# Patient Record
Sex: Female | Born: 1960 | Race: White | Hispanic: No | Marital: Married | Smoking: Former smoker
Health system: Southern US, Community
[De-identification: ages and names within clinical notes are randomized; demographics above are authoritative.]

## PROBLEM LIST (undated history)

## (undated) DIAGNOSIS — C801 Malignant (primary) neoplasm, unspecified: Secondary | ICD-10-CM

## (undated) DIAGNOSIS — F329 Major depressive disorder, single episode, unspecified: Secondary | ICD-10-CM

## (undated) DIAGNOSIS — I639 Cerebral infarction, unspecified: Secondary | ICD-10-CM

## (undated) DIAGNOSIS — F32A Depression, unspecified: Secondary | ICD-10-CM

## (undated) HISTORY — PX: ABDOMINAL HYSTERECTOMY: SHX81

## (undated) HISTORY — PX: BREAST SURGERY: SHX581

## (undated) HISTORY — PX: CHOLECYSTECTOMY: SHX55

---

## 2013-09-14 ENCOUNTER — Emergency Department (HOSPITAL_COMMUNITY)
Admission: EM | Admit: 2013-09-14 | Discharge: 2013-09-14 | Disposition: A | Discharge status: Home / Self Care | Payer: 59 | Attending: Emergency Medicine | Admitting: Emergency Medicine

## 2013-09-14 ENCOUNTER — Encounter (HOSPITAL_COMMUNITY): Payer: Self-pay | Admitting: Emergency Medicine

## 2013-09-14 ENCOUNTER — Emergency Department (HOSPITAL_COMMUNITY): Payer: 59

## 2013-09-14 DIAGNOSIS — Z8673 Personal history of transient ischemic attack (TIA), and cerebral infarction without residual deficits: Secondary | ICD-10-CM | POA: Diagnosis not present

## 2013-09-14 DIAGNOSIS — F411 Generalized anxiety disorder: Secondary | ICD-10-CM | POA: Diagnosis not present

## 2013-09-14 DIAGNOSIS — R55 Syncope and collapse: Secondary | ICD-10-CM | POA: Insufficient documentation

## 2013-09-14 DIAGNOSIS — Z7982 Long term (current) use of aspirin: Secondary | ICD-10-CM | POA: Diagnosis not present

## 2013-09-14 DIAGNOSIS — Z79899 Other long term (current) drug therapy: Secondary | ICD-10-CM | POA: Diagnosis not present

## 2013-09-14 DIAGNOSIS — R209 Unspecified disturbances of skin sensation: Secondary | ICD-10-CM | POA: Diagnosis not present

## 2013-09-14 DIAGNOSIS — G8190 Hemiplegia, unspecified affecting unspecified side: Secondary | ICD-10-CM

## 2013-09-14 DIAGNOSIS — Z853 Personal history of malignant neoplasm of breast: Secondary | ICD-10-CM | POA: Diagnosis not present

## 2013-09-14 DIAGNOSIS — R200 Anesthesia of skin: Secondary | ICD-10-CM

## 2013-09-14 DIAGNOSIS — Z87891 Personal history of nicotine dependence: Secondary | ICD-10-CM | POA: Insufficient documentation

## 2013-09-14 DIAGNOSIS — R519 Headache, unspecified: Secondary | ICD-10-CM

## 2013-09-14 DIAGNOSIS — F329 Major depressive disorder, single episode, unspecified: Secondary | ICD-10-CM | POA: Insufficient documentation

## 2013-09-14 DIAGNOSIS — F3289 Other specified depressive episodes: Secondary | ICD-10-CM | POA: Insufficient documentation

## 2013-09-14 DIAGNOSIS — R51 Headache: Secondary | ICD-10-CM | POA: Insufficient documentation

## 2013-09-14 DIAGNOSIS — R4789 Other speech disturbances: Secondary | ICD-10-CM

## 2013-09-14 DIAGNOSIS — R479 Unspecified speech disturbances: Secondary | ICD-10-CM

## 2013-09-14 HISTORY — DX: Malignant (primary) neoplasm, unspecified: C80.1

## 2013-09-14 HISTORY — DX: Cerebral infarction, unspecified: I63.9

## 2013-09-14 HISTORY — DX: Major depressive disorder, single episode, unspecified: F32.9

## 2013-09-14 HISTORY — DX: Depression, unspecified: F32.A

## 2013-09-14 LAB — COMPREHENSIVE METABOLIC PANEL
ALT: 20 U/L (ref 0–35)
AST: 22 U/L (ref 0–37)
Albumin: 4.1 g/dL (ref 3.5–5.2)
Alkaline Phosphatase: 97 U/L (ref 39–117)
Anion gap: 16 — ABNORMAL HIGH (ref 5–15)
BUN: 14 mg/dL (ref 6–23)
CALCIUM: 9.2 mg/dL (ref 8.4–10.5)
CO2: 21 mEq/L (ref 19–32)
CREATININE: 0.58 mg/dL (ref 0.50–1.10)
Chloride: 99 mEq/L (ref 96–112)
GFR calc non Af Amer: >90 mL/min (ref >90)
GLUCOSE: 140 mg/dL — AB (ref 70–99)
Potassium: 3.6 mEq/L — ABNORMAL LOW (ref 3.7–5.3)
Sodium: 136 mEq/L — ABNORMAL LOW (ref 137–147)
Total Bilirubin: 0.4 mg/dL (ref 0.3–1.2)
Total Protein: 7.8 g/dL (ref 6.0–8.3)

## 2013-09-14 LAB — DIFFERENTIAL
Basophils Absolute: 0 10*3/uL (ref 0.0–0.1)
Basophils Relative: 0 % (ref 0–1)
EOS ABS: 0.2 10*3/uL (ref 0.0–0.7)
EOS PCT: 3 % (ref 0–5)
LYMPHS ABS: 1.2 10*3/uL (ref 0.7–4.0)
Lymphocytes Relative: 23 % (ref 12–46)
Monocytes Absolute: 0.3 10*3/uL (ref 0.1–1.0)
Monocytes Relative: 6 % (ref 3–12)
Neutro Abs: 3.6 10*3/uL (ref 1.7–7.7)
Neutrophils Relative %: 68 % (ref 43–77)

## 2013-09-14 LAB — PROTIME-INR
INR: 0.94 (ref 0.00–1.49)
PROTHROMBIN TIME: 12.6 s (ref 11.6–15.2)

## 2013-09-14 LAB — CBC
HCT: 41.5 % (ref 36.0–46.0)
Hemoglobin: 13.8 g/dL (ref 12.0–15.0)
MCH: 29.2 pg (ref 26.0–34.0)
MCHC: 33.3 g/dL (ref 30.0–36.0)
MCV: 87.9 fL (ref 78.0–100.0)
Platelets: 241 10*3/uL (ref 150–400)
RBC: 4.72 MIL/uL (ref 3.87–5.11)
RDW: 12.6 % (ref 11.5–15.5)
WBC: 5.2 10*3/uL (ref 4.0–10.5)

## 2013-09-14 LAB — URINALYSIS, ROUTINE W REFLEX MICROSCOPIC
Bilirubin Urine: NEGATIVE
GLUCOSE, UA: NEGATIVE mg/dL
Hgb urine dipstick: NEGATIVE
Ketones, ur: NEGATIVE mg/dL
Leukocytes, UA: NEGATIVE
Nitrite: NEGATIVE
PROTEIN: NEGATIVE mg/dL
SPECIFIC GRAVITY, URINE: 1.021 (ref 1.005–1.030)
Urobilinogen, UA: 0.2 mg/dL (ref 0.0–1.0)
pH: 6.5 (ref 5.0–8.0)

## 2013-09-14 LAB — RAPID URINE DRUG SCREEN, HOSP PERFORMED
AMPHETAMINES: NOT DETECTED
BARBITURATES: NOT DETECTED
Benzodiazepines: NOT DETECTED
COCAINE: NOT DETECTED
OPIATES: NOT DETECTED
Tetrahydrocannabinol: NOT DETECTED

## 2013-09-14 LAB — I-STAT TROPONIN, ED: TROPONIN I, POC: 0 ng/mL (ref 0.00–0.08)

## 2013-09-14 LAB — I-STAT CHEM 8, ED
BUN: 14 mg/dL (ref 6–23)
Calcium, Ion: 1.11 mmol/L — ABNORMAL LOW (ref 1.12–1.23)
Chloride: 104 mEq/L (ref 96–112)
Creatinine, Ser: 0.6 mg/dL (ref 0.50–1.10)
Glucose, Bld: 146 mg/dL — ABNORMAL HIGH (ref 70–99)
HEMATOCRIT: 42 % (ref 36.0–46.0)
Hemoglobin: 14.3 g/dL (ref 12.0–15.0)
Potassium: 3.4 mEq/L — ABNORMAL LOW (ref 3.7–5.3)
SODIUM: 139 meq/L (ref 137–147)
TCO2: 25 mmol/L (ref 0–100)

## 2013-09-14 LAB — CBG MONITORING, ED: GLUCOSE-CAPILLARY: 143 mg/dL — AB (ref 70–99)

## 2013-09-14 LAB — APTT: aPTT: 27 seconds (ref 24–37)

## 2013-09-14 MED ORDER — ONDANSETRON HCL 4 MG/2ML IJ SOLN
4.0000 mg | Freq: Once | INTRAMUSCULAR | Status: AC
  Administered 2013-09-14: 4 mg via INTRAVENOUS
  Filled 2013-09-14: qty 2

## 2013-09-14 MED ORDER — IOHEXOL 350 MG/ML SOLN: 50.0000 mL | Freq: Once | INTRAVENOUS | Status: DC | PRN

## 2013-09-14 NOTE — Progress Notes (Addendum)
09/15/13 19:55 [phone] ED CM met with patient at bedside. Patient reports that her PCP is Leighton Ruff. Record was updated. Pt verified payor source is [employer]. No further ED CM needs at this time.   ED CM noted patient to be without PCP or insurance. Met patient in room on the way to MRI ED CM will come back to speak with patient later.

## 2013-09-14 NOTE — ED Notes (Signed)
Pt sts she was at home today while she was working, typing on a computer, then around 1130 she started to feel dizzy and nauseous then vomited. Pt sts she called her daughter then her daughter called 911. sts her left arm "feels funny". Nad, skin warm and dry, resp e/u. Speaking in clear full sentences.

## 2013-09-14 NOTE — Code Documentation (Signed)
53yo female arriving to [facility] via [place] at 21. EMS reports sudden onset left sided weakness and tingling while she was at home typing on her computer.  Patient taken to CT on arrival.  Initial NIHSS 4, see documentation for details and times.  Patient with left arm and bilateral leg drift and decreased sensation on the left.  Patient to go to CTA per Dr. Doy Mince.  Patient is too mild to treat at this time but remains in the window to treat with tPA until 1615 should symptoms worsen.  Patient taken for CTA with Stroke RN, patient back to room.  MD aware that CTA is complete.  Bedside handoff with ED RN Lanelle Bal.

## 2013-09-14 NOTE — ED Notes (Signed)
Pt sts she is still having some nausea but doesn't want any medicine for it right now.

## 2013-09-14 NOTE — ED Notes (Signed)
CT ready for pt

## 2013-09-14 NOTE — ED Notes (Signed)
Pt returned from radiology with Janett Billow, RN

## 2013-09-14 NOTE — Discharge Instructions (Signed)
Your caregiver has seen you today because you are having problems with feelings of weakness, dizziness, and/or fatigue. Weakness has many different causes, some of which are common and others are very rare. Your caregiver has considered some of the most common causes of weakness and feels it is safe for you to go home and be observed. Not every illness or injury can be identified during an emergency department visit, thus follow-up with your primary healthcare provider is important. Medical conditions can also worsen, so it is also important to return immediately as directed below, or if you have other serious concerns develop. RETURN IMMEDIATELY IF you develop new shortness of breath, chest pain, fever, have difficulty moving parts of your body (new weakness, numbness, or incoordination), sudden change in speech, vision, swallowing, or understanding, faint or develop new dizziness, severe headache, become poorly responsive or have an altered mental status compared to baseline for you, new rash, abdominal pain, or bloody stools,  Return sooner also if you develop new problems for which you have not talked to your caregiver but you feel may be emergency medical conditions, or are unable to be cared for safely at home.  You are having a headache. No specific cause was found today for your headache. It may have been a migraine or other cause of headache. Stress, anxiety, fatigue, and depression are common triggers for headaches. Your headache today does not appear to be life-threatening or require hospitalization, but often the exact cause of headaches is not determined in the emergency department. Therefore, follow-up with your doctor is very important to find out what may have caused your headache, and whether or not you need any further diagnostic testing or treatment. Sometimes headaches can appear benign (not harmful), but then more serious symptoms can develop which should prompt an immediate re-evaluation by  your doctor or the emergency department. SEEK MEDICAL ATTENTION IF: You develop possible problems with medications prescribed.  The medications don't resolve your headache, if it recurs , or if you have multiple episodes of vomiting or can't take fluids. You have a change from the usual headache. RETURN IMMEDIATELY IF you develop a sudden, severe headache or confusion, become poorly responsive or faint, develop a fever above 100.18F or problem breathing, have a change in speech, vision, swallowing, or understanding, or develop new weakness, numbness, tingling, incoordination, or have a seizure.

## 2013-09-14 NOTE — ED Notes (Signed)
Pt reports she has metal markers placed in right breast after having breast cancer. Called MRI to make them aware, sts she is still able to come to MRI.

## 2013-09-14 NOTE — ED Notes (Signed)
Pt c/o HA 8/10, pt is refusing to have any medication given, Dr. Marrianne Mood notified, [facility] consult pending. Pt oriented if she change on mind about medication to pleas call for assistance, we'll continue to monitor.

## 2013-09-14 NOTE — ED Notes (Signed)
Pt reports to the ED for eval of sudden onset of left sided numbness and tingling as well as sudden onset of difficulty typing and slow speech. Pt was sitting at her desk typing at 1230 when the symptoms developed. Pt sent a text to her daughter that said help and EMS was dispatched. Pt has hx of TIAs. Pt alert and speaking at this time but her speech is slow. VSS en route. Denies any CP or HA. 12 lead showed NSR. Pt complaining of nausea and had 1 episode of vomiting. She received 4 mg of Zofran IV en route. Stroke team and neurology at bedside. Pt alert, resp e/u, and skin warm and dry.

## 2013-09-14 NOTE — ED Notes (Signed)
Pt c/o nausea. MD made aware.

## 2013-09-14 NOTE — Progress Notes (Signed)
[facility] Community Liaison at [place]:  Will send patient information, upon discharge, on [facility] [employer] and other resources to help patient establish primary care, using the address provided.

## 2013-09-14 NOTE — ED Notes (Signed)
Registration at bedside.

## 2013-09-14 NOTE — ED Notes (Signed)
Pt's sister would like to be called when pt returns from MRI. [phone]

## 2013-09-14 NOTE — ED Provider Notes (Addendum)
CSN: [id number]     Arrival date & time 09/14/13  1356 History   First MD Initiated Contact with Patient 09/14/13 1432     Chief Complaint  Patient presents with  . Code Stroke    An emergency department physician performed an initial assessment on this suspected stroke patient at 69. (Consider location/radiation/quality/duration/timing/severity/associated sxs/prior Treatment) The history is provided by the patient and the EMS personnel.  pt with ?hx tia, c/o onset feeling funny, difficulty typing, slow quiet speech, left arm tingling, while sitting at desk typing today at 1230. Pt was able to text her daughter stating that she needing help. ems arrived, found pt awake and alert, noting unusual speech pattern whereby speech seems halting and in a whisper. No headache. Denies other facial or extremity weakness. No change in vision. Felt normal/at baseline earlier today.     Past Medical History  Diagnosis Date  . Cancer     breast cancer and uterine cancer  . Stroke     TIA  . Depression    Past Surgical History  Procedure Laterality Date  . Abdominal hysterectomy    . Cholecystectomy    . Breast surgery Right     lumpectomy, no lymph nodes   No family history on file. History  Substance Use Topics  . Smoking status: Former Research scientist (life sciences)  . Smokeless tobacco: Not on file  . Alcohol Use: Yes     Comment: weekends   OB History   Grav Para Term Preterm Abortions TAB SAB Ect Mult Living                 Review of Systems  Constitutional: Negative for fever.  HENT: Negative for trouble swallowing.   Eyes: Negative for visual disturbance.  Respiratory: Negative for shortness of breath.   Cardiovascular: Negative for chest pain.  Gastrointestinal: Negative for vomiting and abdominal pain.  Genitourinary: Negative for flank pain.  Musculoskeletal: Negative for back pain and neck pain.  Skin: Negative for rash.  Neurological: Negative for weakness and headaches.  Hematological:  Does not bruise/bleed easily.  Psychiatric/Behavioral: Negative for confusion.      Allergies  Review of patient's allergies indicates no known allergies.  Home Medications   Prior to Admission medications   Medication Sig Start Date End Date Taking? Authorizing Provider  aspirin EC 81 MG tablet Take 81 mg by mouth daily.   Yes Historical Provider, MD  buPROPion (WELLBUTRIN) 75 MG tablet Take 75 mg by mouth 2 (two) times daily.   Yes Historical Provider, MD   There were no vitals taken for this visit. Physical Exam  Nursing note and vitals reviewed. Constitutional: She is oriented to person, place, and time. She appears well-developed and well-nourished. No distress.  HENT:  Mouth/Throat: Oropharynx is clear and moist.  Eyes: Conjunctivae are normal. Pupils are equal, round, and reactive to light. No scleral icterus.  Neck: Neck supple. No tracheal deviation present.  No bruit  Cardiovascular: Normal rate, regular rhythm, normal heart sounds and intact distal pulses.   Pulmonary/Chest: Effort normal and breath sounds normal. No respiratory distress.  Abdominal: Soft. Normal appearance and bowel sounds are normal. She exhibits no distension. There is no tenderness.  Musculoskeletal: She exhibits no edema and no tenderness.  Neurological: She is alert and oriented to person, place, and time. No cranial nerve deficit.  Speech very faint, quiet/whisper, no aphasia.  No pronator drift. Motor intact bil, stre 5/5, subj decreased sensation LUE.   Skin: Skin is warm and  dry. No rash noted. She is not diaphoretic.  Psychiatric:  Mildly anxious appearing.     ED Course  Procedures (including critical care time) Labs Review   Results for orders placed during the hospital encounter of 09/14/13  PROTIME-INR      Result Value Ref Range   Prothrombin Time 12.6  11.6 - 15.2 seconds   INR 0.94  0.00 - 1.49  APTT      Result Value Ref Range   aPTT 27  24 - 37 seconds  CBC      Result  Value Ref Range   WBC 5.2  4.0 - 10.5 K/uL   RBC 4.72  3.87 - 5.11 MIL/uL   Hemoglobin 13.8  12.0 - 15.0 g/dL   HCT 41.5  36.0 - 46.0 %   MCV 87.9  78.0 - 100.0 fL   MCH 29.2  26.0 - 34.0 pg   MCHC 33.3  30.0 - 36.0 g/dL   RDW 12.6  11.5 - 15.5 %   Platelets 241  150 - 400 K/uL  DIFFERENTIAL      Result Value Ref Range   Neutrophils Relative % 68  43 - 77 %   Neutro Abs 3.6  1.7 - 7.7 K/uL   Lymphocytes Relative 23  12 - 46 %   Lymphs Abs 1.2  0.7 - 4.0 K/uL   Monocytes Relative 6  3 - 12 %   Monocytes Absolute 0.3  0.1 - 1.0 K/uL   Eosinophils Relative 3  0 - 5 %   Eosinophils Absolute 0.2  0.0 - 0.7 K/uL   Basophils Relative 0  0 - 1 %   Basophils Absolute 0.0  0.0 - 0.1 K/uL  CBG MONITORING, ED      Result Value Ref Range   Glucose-Capillary 143 (*) 70 - 99 mg/dL  I-STAT TROPOININ, ED      Result Value Ref Range   Troponin i, poc 0.00  0.00 - 0.08 ng/mL   Comment 3           I-STAT CHEM 8, ED      Result Value Ref Range   Sodium 139  137 - 147 mEq/L   Potassium 3.4 (*) 3.7 - 5.3 mEq/L   Chloride 104  96 - 112 mEq/L   BUN 14  6 - 23 mg/dL   Creatinine, Ser 0.60  0.50 - 1.10 mg/dL   Glucose, Bld 146 (*) 70 - 99 mg/dL   Calcium, Ion 1.11 (*) 1.12 - 1.23 mmol/L   TCO2 25  0 - 100 mmol/L   Hemoglobin 14.3  12.0 - 15.0 g/dL   HCT 42.0  36.0 - 46.0 %   Ct Head (brain) Wo Contrast  09/14/2013   CLINICAL DATA:  Confusion.  EXAM: CT HEAD WITHOUT CONTRAST  TECHNIQUE: Contiguous axial images were obtained from the base of the skull through the vertex without intravenous contrast.  COMPARISON:  None.  FINDINGS: No mass. No hydrocephalus. No hemorrhage. No acute bony abnormality. Paranasal sinuses are clear. Mastoids are clear. No acute bony abnormality.  IMPRESSION: No acute abnormality.   Electronically Signed   By: Marcello Moores  Register   On: 09/14/2013 14:26        EKG Interpretation   Date/Time:  Tuesday September 14 2013 14:33:39 EDT Ventricular Rate:  80 PR Interval:   133 QRS Duration: 87 QT Interval:  396 QTC Calculation: 457 R Axis:   72 Text Interpretation:  Sinus rhythm Normal ECG Confirmed by Gabreille Dardis  MD,  Lennette Bihari ([id number]) on 09/14/2013 2:46:49 PM      MDM  Iv ns. Labs. Ct.  Code stroke activated pta.  Reviewed nursing notes and prior charts for additional history.   Neuro team seeing pt in ED.  Ct neg.  Neurology is preceding w tia/cva workup.  Discussed w neurologist, Dr Tyron Russell, they will continue to follow/manage - she states tentative plan at this point would be d/c to home if remainder workup normal/negative.  Recheck pt, no change from prior. Disposition per neurology team.        Mirna Mires, MD 09/14/13 [phone]

## 2013-09-14 NOTE — ED Notes (Signed)
Pt returned from radiology. Reports that she started to get a HA. Reports she doesn't want any pain medicine at this time.

## 2013-09-14 NOTE — Consult Note (Signed)
Referring Physician: Ashok Cordia    Chief Complaint: Code stroke  HPI:                                                                                                                                         Phyllis Ramirez is an 53 y.o. female who was at home today on her computer when she had a sudden onset of N/V and emesis. She states she did not feel well and due to this she called her daughter. EMS was called by her daughter and on arrival EMS reports er speech being very slow. Code stroke was called.  On consultation patients main complaints was continued nausea, and stated she had some decreased sensation on the left corner of her mouth and left foot.   Date last known well: Date: 09/14/2013 Time last known well: Time: 11:45 tPA Given: No: NIHSS 4 symptoms improving Home antiplatelet/AC: ASA 81 mg daily  Past Medical History  Diagnosis Date  . Cancer     breast cancer and uterine cancer  . Stroke     TIA  . Depression     Past Surgical History  Procedure Laterality Date  . Abdominal hysterectomy    . Cholecystectomy    . Breast surgery Right     lumpectomy, no lymph nodes    Family History  Problem Relation Age of Onset  . Hypertension Mother   . Hyperlipidemia Father    Social History:  reports that she has quit smoking. She does not have any smokeless tobacco history on file. She reports that she drinks alcohol. She reports that she does not use illicit drugs.  Allergies: No Known Allergies  Medications:                                                                                                                           Current Facility-Administered Medications  Medication Dose Route Frequency Provider Last Rate Last Dose  . iohexol (OMNIPAQUE) 350 MG/ML injection 50 mL  50 mL Intravenous Once PRN Medication Radiologist, MD       Current Outpatient Prescriptions  Medication Sig Dispense Refill  . aspirin EC 81 MG tablet Take 81 mg by mouth daily.      Marland Kitchen  buPROPion (WELLBUTRIN) 75 MG tablet Take 75 mg by mouth 2 (two) times daily.         ROS:  History obtained from the patient  General ROS: negative for - chills, fatigue, fever, night sweats, weight gain or weight loss Psychological ROS: negative for - behavioral disorder, hallucinations, memory difficulties, mood swings or suicidal ideation Ophthalmic ROS: negative for - blurry vision, double vision, eye pain or loss of vision ENT ROS: negative for - epistaxis, nasal discharge, oral lesions, sore throat, tinnitus or vertigo Allergy and Immunology ROS: negative for - hives or itchy/watery eyes Hematological and Lymphatic ROS: negative for - bleeding problems, bruising or swollen lymph nodes Endocrine ROS: negative for - galactorrhea, hair pattern changes, polydipsia/polyuria or temperature intolerance Respiratory ROS: negative for - cough, hemoptysis, shortness of breath or wheezing Cardiovascular ROS: negative for - chest pain, dyspnea on exertion, edema or irregular heartbeat Gastrointestinal ROS: negative for - abdominal pain, diarrhea, hematemesis, nausea/vomiting or stool incontinence Genito-Urinary ROS: negative for - dysuria, hematuria, incontinence or urinary frequency/urgency Musculoskeletal ROS: negative for - joint swelling or muscular weakness Neurological ROS: as noted in HPI Dermatological ROS: negative for rash and skin lesion changes  Neurologic Examination:                                                                                                      Blood pressure 147/73, pulse 77, temperature 98.1 F (36.7 C), resp. rate 21, SpO2 98.00%.  General: NAD Mental Status: Alert, oriented, thought content appropriate.  Speech fluent without evidence of aphasia.  Able to follow 3 step commands without difficulty. Cranial Nerves: II: Discs  flat bilaterally; Visual fields grossly normal, pupils equal, round, reactive to light and accommodation III,IV, VI: ptosis not present, extra-ocular motions intact bilaterally V,VII: smile symmetric, states she has decreased sensation on the corner of her left mouth VIII: hearing normal bilaterally IX,X: gag reflex present XI: bilateral shoulder shrug XII: midline tongue extension without atrophy or fasciculations  Motor: Patient shows over all poor effort.  When distracted she had 4/5 strength throughout.  On formal exam she had a left arm drift whish had no pronation and did not allow hand to hit face.  Sensory: Pinprick and light touch had subjective decreased sensation on the left foot.  Deep Tendon Reflexes:  Right: Upper Extremity   Left: Upper extremity   biceps (C-5 to C-6) 2/4   biceps (C-5 to C-6) 2/4 tricep (C7) 2/4    triceps (C7) 2/4 Brachioradialis (C6) 2/4  Brachioradialis (C6) 2/4  Lower Extremity Lower Extremity  quadriceps (L-2 to L-4) 1/4   quadriceps (L-2 to L-4) 1/4 Achilles (S1) 0/4   Achilles (S1) 0/4  Plantars: Right: downgoing   Left: downgoing Cerebellar: normal finger-to-nose,  normal heel-to-shin test Gait: not tested CV: pulses palpable throughout     Lab Results: Basic Metabolic Panel:  Recent Labs Lab 09/14/13 1402 09/14/13 1405  NA 136* 139  K 3.6* 3.4*  CL 99 104  CO2 21  --   GLUCOSE 140* 146*  BUN 14 14  CREATININE 0.58 0.60  CALCIUM 9.2  --     Liver Function Tests:  Recent Labs Lab 09/14/13 1402  AST 22  ALT  20  ALKPHOS 97  BILITOT 0.4  PROT 7.8  ALBUMIN 4.1   No results found for this basename: LIPASE, AMYLASE,  in the last 168 hours No results found for this basename: AMMONIA,  in the last 168 hours  CBC:  Recent Labs Lab 09/14/13 1402 09/14/13 1405  WBC 5.2  --   NEUTROABS 3.6  --   HGB 13.8 14.3  HCT 41.5 42.0  MCV 87.9  --   PLT 241  --     Cardiac Enzymes: No results found for this basename:  CKTOTAL, CKMB, CKMBINDEX, TROPONINI,  in the last 168 hours  Lipid Panel: No results found for this basename: CHOL, TRIG, HDL, CHOLHDL, VLDL, LDLCALC,  in the last 168 hours  CBG:  Recent Labs Lab 09/14/13 [place]    Microbiology: No results found for this or any previous visit.  Coagulation Studies:  Recent Labs  09/14/13 1402  LABPROT 12.6  INR 0.94    Imaging: Ct Head (brain) Wo Contrast  09/14/2013   CLINICAL DATA:  Confusion.  EXAM: CT HEAD WITHOUT CONTRAST  TECHNIQUE: Contiguous axial images were obtained from the base of the skull through the vertex without intravenous contrast.  COMPARISON:  None.  FINDINGS: No mass. No hydrocephalus. No hemorrhage. No acute bony abnormality. Paranasal sinuses are clear. Mastoids are clear. No acute bony abnormality.  IMPRESSION: No acute abnormality.   Electronically Signed   By: Marcello Moores  Register   On: 09/14/2013 14:26    Etta Quill PA-C [facility] [phone]  09/14/2013, 4:02 PM  Patient seen and examined.  Clinical course and management discussed.  Necessary edits performed.  I agree with the above.  Assessment and plan of care developed and discussed below.     Assessment: 53 y.o. female presenting with speech difficulties, left sided numbness and weakness, as well as some right sided findings. Multiple functional features noted on neurological examination.  Initial NIHSS of 4.  Head CT reviewed and shows no acute changes.  Patient still within tPA window but symptoms improving and not considered a tPA candidate at this time. Further work up recommended.  Stroke Risk Factors - none  Recommendations: 1.  Frequent neuro checks 2.  Telemetry 3.  CTA of head and neck.  If CTA unremarkable would re-evaluate. If symptoms continue patient will require a MRI of the brain prior to discharge. If symptoms resolve patient may be released on an aspirin a day with MRI of the brain as an outpatient.    Alexis Goodell, MD [facility] [phone]  09/14/2013  4:06 PM   Addendum:  CTA of head and neck reviewed and shows no significant stenosis. Patient remains symptomatic.  Recommendations:  1. MRI of the brain without contrast. If no evidence of an acute infarct, no further neurological work up recommended.   Alexis Goodell, MD [facility] [phone]

## 2014-05-19 ENCOUNTER — Emergency Department (HOSPITAL_BASED_OUTPATIENT_CLINIC_OR_DEPARTMENT_OTHER)
Admission: EM | Admit: 2014-05-19 | Discharge: 2014-05-19 | Disposition: A | Discharge status: Home / Self Care | Payer: No Typology Code available for payment source | Attending: Emergency Medicine | Admitting: Emergency Medicine

## 2014-05-19 ENCOUNTER — Emergency Department (HOSPITAL_BASED_OUTPATIENT_CLINIC_OR_DEPARTMENT_OTHER): Payer: No Typology Code available for payment source

## 2014-05-19 ENCOUNTER — Encounter (HOSPITAL_BASED_OUTPATIENT_CLINIC_OR_DEPARTMENT_OTHER): Payer: Self-pay | Admitting: *Deleted

## 2014-05-19 DIAGNOSIS — F329 Major depressive disorder, single episode, unspecified: Secondary | ICD-10-CM | POA: Insufficient documentation

## 2014-05-19 DIAGNOSIS — S0083XA Contusion of other part of head, initial encounter: Secondary | ICD-10-CM | POA: Diagnosis not present

## 2014-05-19 DIAGNOSIS — Z8673 Personal history of transient ischemic attack (TIA), and cerebral infarction without residual deficits: Secondary | ICD-10-CM | POA: Diagnosis not present

## 2014-05-19 DIAGNOSIS — Z7982 Long term (current) use of aspirin: Secondary | ICD-10-CM | POA: Diagnosis not present

## 2014-05-19 DIAGNOSIS — S161XXA Strain of muscle, fascia and tendon at neck level, initial encounter: Secondary | ICD-10-CM | POA: Insufficient documentation

## 2014-05-19 DIAGNOSIS — Z79899 Other long term (current) drug therapy: Secondary | ICD-10-CM | POA: Diagnosis not present

## 2014-05-19 DIAGNOSIS — S199XXA Unspecified injury of neck, initial encounter: Secondary | ICD-10-CM | POA: Diagnosis present

## 2014-05-19 DIAGNOSIS — Z853 Personal history of malignant neoplasm of breast: Secondary | ICD-10-CM | POA: Insufficient documentation

## 2014-05-19 DIAGNOSIS — Y92410 Unspecified street and highway as the place of occurrence of the external cause: Secondary | ICD-10-CM | POA: Diagnosis not present

## 2014-05-19 DIAGNOSIS — Y998 Other external cause status: Secondary | ICD-10-CM | POA: Insufficient documentation

## 2014-05-19 DIAGNOSIS — Y9389 Activity, other specified: Secondary | ICD-10-CM | POA: Insufficient documentation

## 2014-05-19 DIAGNOSIS — Z87891 Personal history of nicotine dependence: Secondary | ICD-10-CM | POA: Insufficient documentation

## 2014-05-19 DIAGNOSIS — S0093XA Contusion of unspecified part of head, initial encounter: Secondary | ICD-10-CM

## 2014-05-19 DIAGNOSIS — S139XXA Sprain of joints and ligaments of unspecified parts of neck, initial encounter: Secondary | ICD-10-CM

## 2014-05-19 MED ORDER — ACETAMINOPHEN 325 MG PO TABS
650.0000 mg | ORAL_TABLET | ORAL | Status: DC | PRN
  Filled 2014-05-19: qty 2

## 2014-05-19 NOTE — ED Notes (Signed)
At time of discharge pt was upset. States she had not spoken to anyone in over an hour. Refused Tylenol. States she just wants to go home. Phyllis Low PA went into give pt her xray results. She seemed less upset at time of discharge.

## 2014-05-19 NOTE — ED Notes (Signed)
Pt refused tylenol
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
